# Patient Record
Sex: Male | Born: 1965 | Race: White | Hispanic: No | Marital: Married | Smoking: Never smoker
Health system: Southern US, Community
[De-identification: ages and names within clinical notes are randomized; demographics above are authoritative.]

## PROBLEM LIST (undated history)

## (undated) DIAGNOSIS — IMO0002 Reserved for concepts with insufficient information to code with codable children: Secondary | ICD-10-CM

## (undated) HISTORY — PX: WISDOM TOOTH EXTRACTION: SHX21

## (undated) HISTORY — PX: COLONOSCOPY: SHX174

---

## 2015-08-05 ENCOUNTER — Ambulatory Visit
Admission: EM | Admit: 2015-08-05 | Discharge: 2015-08-05 | Disposition: A | Discharge status: Home / Self Care | Payer: BC Managed Care – PPO | Attending: Family Medicine | Admitting: Family Medicine

## 2015-08-05 DIAGNOSIS — J40 Bronchitis, not specified as acute or chronic: Secondary | ICD-10-CM | POA: Diagnosis not present

## 2015-08-05 HISTORY — DX: Reserved for concepts with insufficient information to code with codable children: IMO0002

## 2015-08-05 MED ORDER — BENZONATATE 100 MG PO CAPS: 200.0000 mg | ORAL_CAPSULE | Freq: Three times a day (TID) | ORAL | Status: DC

## 2015-08-05 MED ORDER — PREDNISONE 20 MG PO TABS: ORAL_TABLET | ORAL | Status: DC

## 2015-08-05 MED ORDER — HYDROCOD POLST-CPM POLST ER 10-8 MG/5ML PO SUER: 5.0000 mL | Freq: Two times a day (BID) | ORAL | Status: DC

## 2015-08-05 NOTE — ED Notes (Signed)
Patient c/o cough and fever which both started 2 weeks ago.  Also, his wife was diagnosed with Bronchitis here in our office yesterday.

## 2015-08-05 NOTE — Discharge Instructions (Signed)
Upper Respiratory Infection, Adult Most upper respiratory infections (URIs) are a viral infection of the air passages leading to the lungs. A URI affects the nose, throat, and upper air passages. The most common type of URI is nasopharyngitis and is typically referred to as "the common cold." URIs run their course and usually go away on their own. Most of the time, a URI does not require medical attention, but sometimes a bacterial infection in the upper airways can follow a viral infection. This is called a secondary infection. Sinus and middle ear infections are common types of secondary upper respiratory infections. Bacterial pneumonia can also complicate a URI. A URI can worsen asthma and chronic obstructive pulmonary disease (COPD). Sometimes, these complications can require emergency medical care and may be life threatening.  CAUSES Almost all URIs are caused by viruses. A virus is a type of germ and can spread from one person to another.  RISKS FACTORS You may be at risk for a URI if:   You smoke.   You have chronic heart or lung disease.  You have a weakened defense (immune) system.   You are very young or very old.   You have nasal allergies or asthma.  You work in crowded or poorly ventilated areas.  You work in health care facilities or schools. SIGNS AND SYMPTOMS  Symptoms typically develop 2-3 days after you come in contact with a cold virus. Most viral URIs last 7-10 days. However, viral URIs from the influenza virus (flu virus) can last 14-18 days and are typically more severe. Symptoms may include:   Runny or stuffy (congested) nose.   Sneezing.   Cough.   Sore throat.   Headache.   Fatigue.   Fever.   Loss of appetite.   Pain in your forehead, behind your eyes, and over your cheekbones (sinus pain).  Muscle aches.  DIAGNOSIS  Your health care provider may diagnose a URI by:  Physical exam.  Tests to check that your symptoms are not due to  another condition such as:  Strep throat.  Sinusitis.  Pneumonia.  Asthma. TREATMENT  A URI goes away on its own with time. It cannot be cured with medicines, but medicines may be prescribed or recommended to relieve symptoms. Medicines may help:  Reduce your fever.  Reduce your cough.  Relieve nasal congestion. HOME CARE INSTRUCTIONS   Take medicines only as directed by your health care provider.   Gargle warm saltwater or take cough drops to comfort your throat as directed by your health care provider.  Use a warm mist humidifier or inhale steam from a shower to increase air moisture. This may make it easier to breathe.  Drink enough fluid to keep your urine clear or pale yellow.   Eat soups and other clear broths and maintain good nutrition.   Rest as needed.   Return to work when your temperature has returned to normal or as your health care provider advises. You may need to stay home longer to avoid infecting others. You can also use a face mask and careful hand washing to prevent spread of the virus.  Increase the usage of your inhaler if you have asthma.   Do not use any tobacco products, including cigarettes, chewing tobacco, or electronic cigarettes. If you need help quitting, ask your health care provider. PREVENTION  The best way to protect yourself from getting a cold is to practice good hygiene.   Avoid oral or hand contact with people with cold   symptoms.   Wash your hands often if contact occurs.  There is no clear evidence that vitamin C, vitamin E, echinacea, or exercise reduces the chance of developing a cold. However, it is always recommended to get plenty of rest, exercise, and practice good nutrition.  SEEK MEDICAL CARE IF:   You are getting worse rather than better.   Your symptoms are not controlled by medicine.   You have chills.  You have worsening shortness of breath.  You have brown or red mucus.  You have yellow or brown nasal  discharge.  You have pain in your face, especially when you bend forward.  You have a fever.  You have swollen neck glands.  You have pain while swallowing.  You have white areas in the back of your throat. SEEK IMMEDIATE MEDICAL CARE IF:   You have severe or persistent:  Headache.  Ear pain.  Sinus pain.  Chest pain.  You have chronic lung disease and any of the following:  Wheezing.  Prolonged cough.  Coughing up blood.  A change in your usual mucus.  You have a stiff neck.  You have changes in your:  Vision.  Hearing.  Thinking.  Mood. MAKE SURE YOU:   Understand these instructions.  Will watch your condition.  Will get help right away if you are not doing well or get worse.   This information is not intended to replace advice given to you by your health care provider. Make sure you discuss any questions you have with your health care provider.   Document Released: 10/18/2000 Document Revised: 09/08/2014 Document Reviewed: 07/30/2013 Elsevier Interactive Patient Education 2016 [employer]  

## 2015-08-05 NOTE — ED Provider Notes (Signed)
CSN: [id number]     Arrival date & time 08/05/15  0804 History   First MD Initiated Contact with Patient 08/05/15 [phone]     No chief complaint on file.  (Consider location/radiation/quality/duration/timing/severity/associated sxs/prior Treatment) HPI   49 year old male who presents with a two-week history of a cough and fever. He states that the cough with his persistent and is not allowing him to sleep. He states that 2 weeks ago started as a simple cold with stuffiness of his nose and a sore throat. That seemed to clear up within the cough began.. Is nonproductive. He is afebrile today. His wife was diagnosed with bronchitis yesterday. He states that her cough is painful  over her chest but his does not bother him  Past Medical History  Diagnosis Date  . Squamous cell carcinoma ([place])    History reviewed. No pertinent past surgical history. History reviewed. No pertinent family history. Social History  Substance Use Topics  . Smoking status: Never Smoker   . Smokeless tobacco: Current User    Types: Chew  . Alcohol Use: No    Review of Systems  Constitutional: Positive for fever, chills, activity change and fatigue.  HENT: Positive for congestion.   Respiratory: Positive for cough. Negative for shortness of breath, wheezing and stridor.   All other systems reviewed and are negative.   Allergies  Review of patient's allergies indicates no known allergies.  Home Medications   Prior to Admission medications   Medication Sig Start Date End Date Taking? Authorizing Provider  benzonatate (TESSALON) 100 MG capsule Take 2 capsules (200 mg total) by mouth every 8 (eight) hours. 08/05/15   Lorin Picket, PA-C  chlorpheniramine-HYDROcodone (TUSSIONEX PENNKINETIC ER) 10-8 MG/5ML SUER Take 5 mLs by mouth 2 (two) times daily. 08/05/15   Lorin Picket, PA-C  predniSONE (DELTASONE) 20 MG tablet Take 2 tablets (40 mg) daily by mouth 08/05/15   Lorin Picket, PA-C   Meds Ordered and  Administered this Visit  Medications - No data to display  BP 122/77 mmHg  Pulse 66  Temp(Src) 97.8 F (36.6 C) (Oral)  Resp 16  Ht 5\' 8"  (1.727 m)  Wt 205 lb (92.987 kg)  BMI 31.18 kg/m2  SpO2 99% No data found.   Physical Exam  Constitutional: He is oriented to person, place, and time. He appears well-developed and well-nourished.  HENT:  Head: Normocephalic and atraumatic.  Right Ear: External ear normal.  Left Ear: External ear normal.  Nose: Nose normal.  Mouth/Throat: Oropharynx is clear and moist. No oropharyngeal exudate.  Eyes: Conjunctivae are normal. Pupils are equal, round, and reactive to light.  Neck: Normal range of motion. Neck supple.  Pulmonary/Chest: Effort normal and breath sounds normal. No respiratory distress. He has no wheezes. He has no rales.  Musculoskeletal: Normal range of motion. He exhibits no edema or tenderness.  Lymphadenopathy:    He has no cervical adenopathy.  Neurological: He is alert and oriented to person, place, and time.  Skin: Skin is warm and dry.  Psychiatric: He has a normal mood and affect. His behavior is normal. Judgment and thought content normal.  Nursing note and vitals reviewed.   ED Course  Procedures (including critical care time)  Labs Review Labs Reviewed - No data to display  Imaging Review No results found.   Visual Acuity Review  Right Eye Distance:   Left Eye Distance:   Bilateral Distance:    Right Eye Near:   Left Eye Near:  Bilateral Near:         MDM   1. Bronchitis    There are no discharge medications for this patient. Plan: 1. Test/x-ray results and diagnosis reviewed with patient 2. rx as per orders; risks, benefits, potential side effects reviewed with patient 3. Recommend supportive treatment with Fluids and rest. Because of his constant coughing we will try a short course of prednisone. I did warn him that it may take 2-3 months for the cough to subside totally. I also told him  this is most likely a viral illness and does not require antibacterial at this time. If he continues to have problems he should follow-up with his primary care physician 4. F/u prn if symptoms worsen or don't improve     Lorin Picket, PA-C 08/05/15 [id number]

## 2017-10-20 ENCOUNTER — Ambulatory Visit
Admission: EM | Admit: 2017-10-20 | Discharge: 2017-10-20 | Disposition: A | Discharge status: Home / Self Care | Payer: BC Managed Care – PPO | Attending: Family Medicine | Admitting: Family Medicine

## 2017-10-20 DIAGNOSIS — J209 Acute bronchitis, unspecified: Secondary | ICD-10-CM

## 2017-10-20 MED ORDER — PREDNISONE 50 MG PO TABS: ORAL_TABLET | ORAL | 0 refills | Status: DC

## 2017-10-20 MED ORDER — DOXYCYCLINE HYCLATE 100 MG PO CAPS: 100.0000 mg | ORAL_CAPSULE | Freq: Two times a day (BID) | ORAL | 0 refills | Status: DC

## 2017-10-20 MED ORDER — HYDROCOD POLST-CPM POLST ER 10-8 MG/5ML PO SUER: 5.0000 mL | Freq: Every evening | ORAL | 0 refills | Status: DC | PRN

## 2017-10-20 MED ORDER — BENZONATATE 100 MG PO CAPS: 100.0000 mg | ORAL_CAPSULE | Freq: Three times a day (TID) | ORAL | 0 refills | Status: DC | PRN

## 2017-10-20 NOTE — Discharge Instructions (Signed)
Prednisone as prescribed.  Tessalon during the day. Tussionex at night.  Antibiotic if you fail to improve or worsen.  Take care  Dr. Lacinda Axon

## 2017-10-20 NOTE — ED Triage Notes (Signed)
As per patient has cold onset 5 days OTC taken not improving, HA, little fluid in ears.

## 2017-10-20 NOTE — ED Provider Notes (Signed)
[facility]    CSN: [id number] Arrival date & time: 10/20/17  0954  History   Chief Complaint Chief Complaint  Patient presents with  . Cough   HPI  52 year old male presents with respiratory symptoms.  Patient reports that he has been sick since Tuesday.  He reports runny nose, congestion, and cough.  Cough is his predominant concern as it is more severe.  Cough is nonproductive.  Worse at night.  He has taken Mucinex and Tessalon without relief.  His cough is interfering with sleep.  No reports of fever or shortness of breath.  No other associated symptoms.  No other complaints at this time.  Social History Social History   Tobacco Use  . Smoking status: Never Smoker  . Smokeless tobacco: Current User    Types: Chew  Substance Use Topics  . Alcohol use: No  . Drug use: Not on file   Allergies   Patient has no known allergies.   Review of Systems Review of Systems  Constitutional: Negative.   HENT: Positive for congestion and rhinorrhea.   Respiratory: Positive for cough. Negative for shortness of breath.    Physical Exam Triage Vital Signs ED Triage Vitals  Enc Vitals Group     BP 10/20/17 1009 117/76     Pulse Rate 10/20/17 1009 67     Resp 10/20/17 1009 16     Temp 10/20/17 1009 98.2 F (36.8 C)     Temp Source 10/20/17 1009 Oral     SpO2 10/20/17 1009 96 %     Weight 10/20/17 1007 208 lb (94.3 kg)     Height 10/20/17 1007 5\' 9"  (1.753 m)     Head Circumference --      Peak Flow --      Pain Score 10/20/17 1024 0     Pain Loc --      Pain Edu? --      Excl. in [place]? --    Updated Vital Signs BP 117/76 (BP Location: Left Arm)   Pulse 67   Temp 98.2 F (36.8 C) (Oral)   Resp 16   Ht 5\' 9"  (1.753 m)   Wt 208 lb (94.3 kg)   SpO2 96%   BMI 30.72 kg/m     Physical Exam  Constitutional: He is oriented to person, place, and time. He appears well-developed. No distress.  HENT:  Head: Normocephalic and atraumatic.  Nose: Nose normal.    Mouth/Throat: Oropharynx is clear and moist.  Cardiovascular: Normal rate and regular rhythm.  Pulmonary/Chest: Effort normal.  Coarse breath sounds.  Neurological: He is alert and oriented to person, place, and time.  Psychiatric: He has a normal mood and affect. His behavior is normal.  Nursing note and vitals reviewed.  UC Treatments / Results  Labs (all labs ordered are listed, but only abnormal results are displayed) Labs Reviewed - No data to display  EKG None  Radiology No results found.  Procedures Procedures (including critical care time)  Medications Ordered in UC Medications - No data to display  Initial Impression / Assessment and Plan / UC Course  I have reviewed the triage vital signs and the nursing notes.  Pertinent labs & imaging results that were available during my care of the patient were reviewed by me and considered in my medical decision making (see chart for details).    52 year old male presents with acute bronchitis.  Treating with prednisone, Tessalon Per[place], Tussionex.  Doxycycline to be filled if he  fails to improve or worsens (wait and see antibiotic).  Final Clinical Impressions(s) / UC Diagnoses   Final diagnoses:  Acute bronchitis, unspecified organism     Discharge Instructions     Prednisone as prescribed.  Tessalon during the day. Tussionex at night.  Antibiotic if you fail to improve or worsen.  Take care  Dr. Lacinda Axon    ED Prescriptions    Medication Sig Dispense Auth. Provider   predniSONE (DELTASONE) 50 MG tablet 1 tablet daily x 5 days. 5 tablet Geneen Dieter G, DO   benzonatate (TESSALON) 100 MG capsule Take 1 capsule (100 mg total) by mouth 3 (three) times daily as needed. 30 capsule [place], [place] G, DO   chlorpheniramine-HYDROcodone (TUSSIONEX PENNKINETIC ER) 10-8 MG/5ML SUER Take 5 mLs by mouth at bedtime as needed. 60 mL Larine Fielding G, DO   doxycycline (VIBRAMYCIN) 100 MG capsule Take 1 capsule (100 mg total) by mouth  2 (two) times daily. 14 capsule Coral Spikes, DO     Controlled Substance Prescriptions [place] Controlled Substance Registry consulted? No; Not needed as Rx is cough suppressant.    Coral Spikes, DO 10/20/17 1030

## 2021-03-01 ENCOUNTER — Other Ambulatory Visit: Payer: Self-pay | Admitting: Nurse Practitioner

## 2021-03-01 DIAGNOSIS — R1033 Periumbilical pain: Secondary | ICD-10-CM

## 2021-03-01 DIAGNOSIS — K429 Umbilical hernia without obstruction or gangrene: Secondary | ICD-10-CM

## 2021-03-01 DIAGNOSIS — R1900 Intra-abdominal and pelvic swelling, mass and lump, unspecified site: Secondary | ICD-10-CM

## 2021-03-10 ENCOUNTER — Ambulatory Visit
Admission: RE | Admit: 2021-03-10 | Discharge: 2021-03-10 | Disposition: A | Discharge status: Home / Self Care | Payer: No Typology Code available for payment source | Source: Ambulatory Visit | Attending: Nurse Practitioner | Admitting: Nurse Practitioner

## 2021-03-10 ENCOUNTER — Other Ambulatory Visit: Payer: Self-pay

## 2021-03-10 DIAGNOSIS — K429 Umbilical hernia without obstruction or gangrene: Secondary | ICD-10-CM | POA: Diagnosis present

## 2021-03-10 DIAGNOSIS — R1033 Periumbilical pain: Secondary | ICD-10-CM | POA: Diagnosis present

## 2021-03-10 DIAGNOSIS — R1900 Intra-abdominal and pelvic swelling, mass and lump, unspecified site: Secondary | ICD-10-CM | POA: Insufficient documentation

## 2021-05-17 ENCOUNTER — Ambulatory Visit: Payer: Self-pay | Admitting: General Surgery

## 2021-05-17 NOTE — H&P (Signed)
PATIENT PROFILE: Markell Sciascia is a 56 y.o. male who presents to the Clinic for consultation at the request of Dr. Dayton Martes for evaluation of umbilical hernia.  PCP: Sallee Lange, NP  HISTORY OF PRESENT ILLNESS: [name] reports he has had a ventral hernia since many years ago. He endorses that recently he had an episode that the hernia protrudes and it was very painful. Initially it was difficult to reduce the hernia but he finally was able to reduce it completely. He was seen by PCP, CT scan of the abdomen pelvis was done showing fat-containing ventral hernia with mild stranding concerning of chronic incarceration. Patient endorses that he has intermittent midline anterior abdominal pain. Pain aggravated by talking loud. No specific alleviating factors identified. Patient denies any episode of abdominal distention nausea or vomiting or ischemic changes around the hernia. I personally read the images. There is also a small umbilical hernia.  PROBLEM LIST: Problem List Date Reviewed: 02/25/2021  Noted  Aortic atherosclerosis (CMS-HCC) 03/2021  Overview  Incidentally noted on CT of abdomen.   Obesity (BMI 30-39.9), unspecified Unknown  Umbilical hernia Unknown  IGT (impaired glucose tolerance) 05/08/2017  Overview  A1c 5.7%   Hyperlipidemia 05/08/2014  Overview  Mild    GENERAL REVIEW OF SYSTEMS:   General ROS: negative for - chills, fatigue, fever, weight gain or weight loss Allergy and Immunology ROS: negative for - hives  Hematological and Lymphatic ROS: negative for - bleeding problems or bruising, negative for palpable nodes Endocrine ROS: negative for - heat or cold intolerance, hair changes Respiratory ROS: negative for - cough, shortness of breath or wheezing Cardiovascular ROS: no chest pain or palpitations GI ROS: negative for nausea, vomiting, diarrhea, constipation. Positive for abdominal pain Musculoskeletal ROS: negative for - joint swelling or  muscle pain Neurological ROS: negative for - confusion, syncope Dermatological ROS: negative for pruritus and rash Psychiatric: negative for anxiety, depression, difficulty sleeping and memory loss  MEDICATIONS: Current Outpatient Medications  Medication Sig Dispense Refill   Herbal Supplement Herbal Name: Essential oils sometimes   No current facility-administered medications for this visit.   ALLERGIES: Patient has no known allergies.  PAST MEDICAL HISTORY: Past Medical History:  Diagnosis Date   Aortic atherosclerosis (CMS-HCC) 03/2021  Incidentally noted on CT of abdomen.   Arthritis  Rt knee   History of chicken pox   Hyperlipidemia 2016  Mild   IGT (impaired glucose tolerance) 05/2017  A1c 5.7%   Obesity (BMI 30-39.9), unspecified   Umbilical hernia   PAST SURGICAL HISTORY: Past Surgical History:  Procedure Laterality Date   COLONOSCOPY 12/22/2020  Tubular adenoma/Hyperplastic polyp/Repeat 87yr/SMR   PHOTOREFRACTIVE KERATOTOMY/LASIK   VASECTOMY    FAMILY HISTORY: Family History  Problem Relation Age of Onset   Diabetes type II Father   High blood pressure (Hypertension) Father   No Known Problems Sister   No Known Problems Brother   No Known Problems Daughter   No Known Problems Son   Bone cancer Paternal Grandmother   Stroke Paternal Grandfather  Smoker & binge drinker   No Known Problems Brother    SOCIAL HISTORY: Social History   Socioeconomic History   Marital status: Married  Spouse name: JSharee Pimple  Number of children: 2  Occupational History   Occupation: PEnvironmental education officer Tobacco Use   Smoking status: Never   Smokeless tobacco: Current  Types: Snuff   Tobacco comments:  1 can smokeless tobacco/day  Vaping Use   Vaping Use: Never used  Substance  and Sexual Activity   Alcohol use: No   Drug use: No   Sexual activity: Yes  Partners: Female  Birth control/protection: None, Surgical   PHYSICAL EXAM: Vitals:  04/07/21 1346  BP: (!) 148/85   Pulse: 71   Body mass index is 30.42 kg/m. Weight: 93.4 kg (206 lb)   GENERAL: Alert, active, oriented x3  HEENT: Pupils equal reactive to light. Extraocular movements are intact. Sclera clear. Palpebral conjunctiva normal red color.Pharynx clear.  NECK: Supple with no palpable mass and no adenopathy.  LUNGS: Sound clear with no rales rhonchi or wheezes.  HEART: Regular rhythm S1 and S2 without murmur.  ABDOMEN: Soft and depressible, nontender with no palpable mass, no hepatomegaly. Soft partially incarcerated ventral hernia, no skin changes minimal tender to palpation at this moment.  EXTREMITIES: Well-developed well-nourished symmetrical with no dependent edema.  NEUROLOGICAL: Awake alert oriented, facial expression symmetrical, moving all extremities.  REVIEW OF DATA: I have reviewed the following data today: No visits with results within 3 Month(s) from this visit.  Latest known visit with results is:  Office Visit on 08/11/2020  Component Date Value   WBC (White Blood Cell Co* 08/11/2020 4.2   RBC (Red Blood Cell Coun* 08/11/2020 4.99   Hemoglobin 08/11/2020 15.2   Hematocrit 08/11/2020 43.7   MCV (Mean Corpuscular Vo* 08/11/2020 87.6   MCH (Mean Corpuscular He* 08/11/2020 30.5   MCHC (Mean Corpuscular H* 08/11/2020 34.8   Platelet Count 08/11/2020 157   RDW-CV (Red Cell Distrib* 08/11/2020 12.8   MPV (Mean Platelet Volum* 08/11/2020 10.2   Neutrophils 08/11/2020 2.30   Lymphocytes 08/11/2020 1.40   Mixed Count 08/11/2020 0.50   Neutrophil % 08/11/2020 55.1   Lymphocyte % 08/11/2020 32.8   Mixed % 08/11/2020 12.1   Glucose 08/11/2020 100   Sodium 08/11/2020 140   Potassium 08/11/2020 4.4   Chloride 08/11/2020 105   Carbon Dioxide (CO2) 08/11/2020 29.0   Urea Nitrogen (BUN) 08/11/2020 14   Creatinine 08/11/2020 1.1   Glomerular Filtration Ra* 08/11/2020 69   Calcium 08/11/2020 10.1   AST 08/11/2020 41 (H)   ALT 08/11/2020 77 (H)   Alk Phos (alkaline Phosp*  08/11/2020 76   Albumin 08/11/2020 4.5   Bilirubin, Total 08/11/2020 0.7   Protein, Total 08/11/2020 7.2   A/G Ratio 08/11/2020 1.7   Hep C Virus Ab - [facility] 08/11/2020 <0.1   Hemoglobin A1C 08/11/2020 5.7 (H)   Average Blood Glucose (C* 08/11/2020 117   Cholesterol, Total 08/11/2020 171   Triglyceride 08/11/2020 175   HDL (High Density Lipopr* 08/11/2020 31.5   LDL Calculated 08/11/2020 105   VLDL Cholesterol 08/11/2020 35   Cholesterol/HDL Ratio 08/11/2020 5.4   PSA (Prostate Specific A* 08/11/2020 0.24   Thyroid Stimulating Horm* 08/11/2020 2.708    ASSESSMENT: Mr. Colt is a 56 y.o. male presenting for consultation for ventral hernia.   The patient presents with a symptomatic ventral hernia. Patient was oriented about the diagnosis of ventral hernia and its implication. The patient was oriented about the treatment alternatives (observation vs surgical repair). Due to patient symptoms, repair is recommended. Patient oriented about the surgical procedure, the use of mesh and its risk of complications such as: infection, bleeding, injury to vasculature, injury to bowel or bladder, and chronic pain, intestinal obstruction, among others.   Ventral hernia without obstruction or gangrene [K43.9]  PLAN: 1. Robotic assisted laparoscopic ventral hernia repair with mesh ([id number], [id number]) 2. CBC, CMP 3. Avoid taking aspirin 5 days before procedure 4.  Contact us if has any question or concern.   Patient verbalized understanding, all questions were answered, and were agreeable with the plan outlined above.   Herbert Pun, MD

## 2021-05-17 NOTE — H&P (View-Only) (Signed)
PATIENT PROFILE: Andres Underwood is a 56 y.o. male who presents to the Clinic for consultation at the request of Dr. Dayton Martes for evaluation of umbilical hernia.  PCP: Sallee Lange, NP  HISTORY OF PRESENT ILLNESS: [name] reports he has had a ventral hernia since many years ago. He endorses that recently he had an episode that the hernia protrudes and it was very painful. Initially it was difficult to reduce the hernia but he finally was able to reduce it completely. He was seen by PCP, CT scan of the abdomen pelvis was done showing fat-containing ventral hernia with mild stranding concerning of chronic incarceration. Patient endorses that he has intermittent midline anterior abdominal pain. Pain aggravated by talking loud. No specific alleviating factors identified. Patient denies any episode of abdominal distention nausea or vomiting or ischemic changes around the hernia. I personally read the images. There is also a small umbilical hernia.  PROBLEM LIST: Problem List Date Reviewed: 02/25/2021  Noted  Aortic atherosclerosis (CMS-HCC) 03/2021  Overview  Incidentally noted on CT of abdomen.   Obesity (BMI 30-39.9), unspecified Unknown  Umbilical hernia Unknown  IGT (impaired glucose tolerance) 05/08/2017  Overview  A1c 5.7%   Hyperlipidemia 05/08/2014  Overview  Mild    GENERAL REVIEW OF SYSTEMS:   General ROS: negative for - chills, fatigue, fever, weight gain or weight loss Allergy and Immunology ROS: negative for - hives  Hematological and Lymphatic ROS: negative for - bleeding problems or bruising, negative for palpable nodes Endocrine ROS: negative for - heat or cold intolerance, hair changes Respiratory ROS: negative for - cough, shortness of breath or wheezing Cardiovascular ROS: no chest pain or palpitations GI ROS: negative for nausea, vomiting, diarrhea, constipation. Positive for abdominal pain Musculoskeletal ROS: negative for - joint swelling or  muscle pain Neurological ROS: negative for - confusion, syncope Dermatological ROS: negative for pruritus and rash Psychiatric: negative for anxiety, depression, difficulty sleeping and memory loss  MEDICATIONS: Current Outpatient Medications  Medication Sig Dispense Refill   Herbal Supplement Herbal Name: Essential oils sometimes   No current facility-administered medications for this visit.   ALLERGIES: Patient has no known allergies.  PAST MEDICAL HISTORY: Past Medical History:  Diagnosis Date   Aortic atherosclerosis (CMS-HCC) 03/2021  Incidentally noted on CT of abdomen.   Arthritis  Rt knee   History of chicken pox   Hyperlipidemia 2016  Mild   IGT (impaired glucose tolerance) 05/2017  A1c 5.7%   Obesity (BMI 30-39.9), unspecified   Umbilical hernia   PAST SURGICAL HISTORY: Past Surgical History:  Procedure Laterality Date   COLONOSCOPY 12/22/2020  Tubular adenoma/Hyperplastic polyp/Repeat 87yr/SMR   PHOTOREFRACTIVE KERATOTOMY/LASIK   VASECTOMY    FAMILY HISTORY: Family History  Problem Relation Age of Onset   Diabetes type II Father   High blood pressure (Hypertension) Father   No Known Problems Sister   No Known Problems Brother   No Known Problems Daughter   No Known Problems Son   Bone cancer Paternal Grandmother   Stroke Paternal Grandfather  Smoker & binge drinker   No Known Problems Brother    SOCIAL HISTORY: Social History   Socioeconomic History   Marital status: Married  Spouse name: JSharee Pimple  Number of children: 2  Occupational History   Occupation: PEnvironmental education officer Tobacco Use   Smoking status: Never   Smokeless tobacco: Current  Types: Snuff   Tobacco comments:  1 can smokeless tobacco/day  Vaping Use   Vaping Use: Never used  Substance  and Sexual Activity   Alcohol use: No   Drug use: No   Sexual activity: Yes  Partners: Female  Birth control/protection: None, Surgical   PHYSICAL EXAM: Vitals:  04/07/21 1346  BP: (!) 148/85   Pulse: 71   Body mass index is 30.42 kg/m. Weight: 93.4 kg (206 lb)   GENERAL: Alert, active, oriented x3  HEENT: Pupils equal reactive to light. Extraocular movements are intact. Sclera clear. Palpebral conjunctiva normal red color.Pharynx clear.  NECK: Supple with no palpable mass and no adenopathy.  LUNGS: Sound clear with no rales rhonchi or wheezes.  HEART: Regular rhythm S1 and S2 without murmur.  ABDOMEN: Soft and depressible, nontender with no palpable mass, no hepatomegaly. Soft partially incarcerated ventral hernia, no skin changes minimal tender to palpation at this moment.  EXTREMITIES: Well-developed well-nourished symmetrical with no dependent edema.  NEUROLOGICAL: Awake alert oriented, facial expression symmetrical, moving all extremities.  REVIEW OF DATA: I have reviewed the following data today: No visits with results within 3 Month(s) from this visit.  Latest known visit with results is:  Office Visit on 08/11/2020  Component Date Value   WBC (White Blood Cell Co* 08/11/2020 4.2   RBC (Red Blood Cell Coun* 08/11/2020 4.99   Hemoglobin 08/11/2020 15.2   Hematocrit 08/11/2020 43.7   MCV (Mean Corpuscular Vo* 08/11/2020 87.6   MCH (Mean Corpuscular He* 08/11/2020 30.5   MCHC (Mean Corpuscular H* 08/11/2020 34.8   Platelet Count 08/11/2020 157   RDW-CV (Red Cell Distrib* 08/11/2020 12.8   MPV (Mean Platelet Volum* 08/11/2020 10.2   Neutrophils 08/11/2020 2.30   Lymphocytes 08/11/2020 1.40   Mixed Count 08/11/2020 0.50   Neutrophil % 08/11/2020 55.1   Lymphocyte % 08/11/2020 32.8   Mixed % 08/11/2020 12.1   Glucose 08/11/2020 100   Sodium 08/11/2020 140   Potassium 08/11/2020 4.4   Chloride 08/11/2020 105   Carbon Dioxide (CO2) 08/11/2020 29.0   Urea Nitrogen (BUN) 08/11/2020 14   Creatinine 08/11/2020 1.1   Glomerular Filtration Ra* 08/11/2020 69   Calcium 08/11/2020 10.1   AST 08/11/2020 41 (H)   ALT 08/11/2020 77 (H)   Alk Phos (alkaline Phosp*  08/11/2020 76   Albumin 08/11/2020 4.5   Bilirubin, Total 08/11/2020 0.7   Protein, Total 08/11/2020 7.2   A/G Ratio 08/11/2020 1.7   Hep C Virus Ab - [facility] 08/11/2020 <0.1   Hemoglobin A1C 08/11/2020 5.7 (H)   Average Blood Glucose (C* 08/11/2020 117   Cholesterol, Total 08/11/2020 171   Triglyceride 08/11/2020 175   HDL (High Density Lipopr* 08/11/2020 31.5   LDL Calculated 08/11/2020 105   VLDL Cholesterol 08/11/2020 35   Cholesterol/HDL Ratio 08/11/2020 5.4   PSA (Prostate Specific A* 08/11/2020 0.24   Thyroid Stimulating Horm* 08/11/2020 2.708    ASSESSMENT: Mr. Colt is a 56 y.o. male presenting for consultation for ventral hernia.   The patient presents with a symptomatic ventral hernia. Patient was oriented about the diagnosis of ventral hernia and its implication. The patient was oriented about the treatment alternatives (observation vs surgical repair). Due to patient symptoms, repair is recommended. Patient oriented about the surgical procedure, the use of mesh and its risk of complications such as: infection, bleeding, injury to vasculature, injury to bowel or bladder, and chronic pain, intestinal obstruction, among others.   Ventral hernia without obstruction or gangrene [K43.9]  PLAN: 1. Robotic assisted laparoscopic ventral hernia repair with mesh ([id number], [id number]) 2. CBC, CMP 3. Avoid taking aspirin 5 days before procedure 4.  Contact us if has any question or concern.   Patient verbalized understanding, all questions were answered, and were agreeable with the plan outlined above.   Herbert Pun, MD

## 2021-05-19 ENCOUNTER — Encounter
Admission: RE | Admit: 2021-05-19 | Discharge: 2021-05-19 | Disposition: A | Discharge status: Home / Self Care | Payer: BC Managed Care – PPO | Source: Ambulatory Visit | Attending: General Surgery | Admitting: General Surgery

## 2021-05-19 ENCOUNTER — Other Ambulatory Visit: Payer: Self-pay

## 2021-05-19 NOTE — Patient Instructions (Signed)
Your procedure is scheduled on: 05/23/21 Report to [facility]. To find out your arrival time please call [phone] between 1PM - 3PM on 05/20/21.  Remember: Instructions that are not followed completely may result in serious medical risk, up to and including death, or upon the discretion of your surgeon and anesthesiologist your surgery may need to be rescheduled.     _X__ 1. Do not eat food or ddrink any liquids after midnight the night before your procedure.                 No gum chewing or hard candies.   __X__2.  On the morning of surgery brush your teeth with toothpaste and water, you                 may rinse your mouth with mouthwash if you wish.  Do not swallow any              toothpaste of mouthwash.     _X__ 3.  No Alcohol for 24 hours before or after surgery.   _X__ 4.  Do Not Smoke or use e-cigarettes For 24 Hours Prior to Your Surgery.                 Do not use any chewable tobacco products for at least 6 hours prior to                 surgery.  ____  5.  Bring all medications with you on the day of surgery if instructed.   __X__  6.  Notify your doctor if there is any change in your medical condition      (cold, fever, infections).     Do not wear jewelry, make-up, hairpins, clips or nail polish. Do not wear lotions, powders, or perfumes.  Do not shave body hair 48 hours prior to surgery. Men may shave face and neck. Do not bring valuables to the hospital.    [facility] is not responsible for any belongings or valuables.  Contacts, dentures/partials or body piercings may not be worn into surgery. Bring a case for your contacts, glasses or hearing aids, a denture cup will be supplied. Leave your suitcase in the car. After surgery it may be brought to your room. For patients admitted to the hospital, discharge time is determined by your treatment team.   Patients discharged the day of surgery will not be  allowed to drive home.   Please read over the following fact sheets that you were given:     __X__ Take these medicines the morning of surgery with A SIP OF WATER:    1. none  2.   3.   4.  5.  6.  ____ Fleet Enema (as directed)   ____ Use CHG Soap/SAGE wipes as directed  ____ Use inhalers on the day of surgery  ____ Stop metformin/Janumet/Farxiga 2 days prior to surgery    ____ Take 1/2 of usual insulin dose the night before surgery. No insulin the morning          of surgery.   ____ Stop Blood Thinners Coumadin/Plavix/Xarelto/Pleta/Pradaxa/Eliquis/Effient/Aspirin  on   Or contact your Surgeon, Cardiologist or Medical Doctor regarding  ability to stop your blood thinners  __X__ Stop Anti-inflammatories 7 days before surgery such as Advil, Ibuprofen, Motrin,  BC or Goodies Powder, Naprosyn, Naproxen, Aleve, Aspirin    __X__ Stop all herbals and supplements, fish oil or  vitamins  until after surgery.    ____ Bring C-Pap to the hospital.

## 2021-05-23 ENCOUNTER — Ambulatory Visit: Payer: BC Managed Care – PPO | Admitting: Anesthesiology

## 2021-05-23 ENCOUNTER — Encounter: Payer: Self-pay | Admitting: General Surgery

## 2021-05-23 ENCOUNTER — Other Ambulatory Visit: Payer: Self-pay

## 2021-05-23 ENCOUNTER — Ambulatory Visit
Admission: RE | Admit: 2021-05-23 | Discharge: 2021-05-23 | Disposition: A | Discharge status: Home / Self Care | Payer: BC Managed Care – PPO | Attending: General Surgery | Admitting: General Surgery

## 2021-05-23 ENCOUNTER — Encounter
Admission: RE | Disposition: A | Discharge status: Home / Self Care | Payer: Self-pay | Source: Home / Self Care | Attending: General Surgery

## 2021-05-23 DIAGNOSIS — K219 Gastro-esophageal reflux disease without esophagitis: Secondary | ICD-10-CM | POA: Diagnosis not present

## 2021-05-23 DIAGNOSIS — C4492 Squamous cell carcinoma of skin, unspecified: Secondary | ICD-10-CM | POA: Insufficient documentation

## 2021-05-23 DIAGNOSIS — E669 Obesity, unspecified: Secondary | ICD-10-CM | POA: Insufficient documentation

## 2021-05-23 DIAGNOSIS — K439 Ventral hernia without obstruction or gangrene: Secondary | ICD-10-CM | POA: Diagnosis not present

## 2021-05-23 DIAGNOSIS — K429 Umbilical hernia without obstruction or gangrene: Secondary | ICD-10-CM | POA: Insufficient documentation

## 2021-05-23 DIAGNOSIS — M1990 Unspecified osteoarthritis, unspecified site: Secondary | ICD-10-CM | POA: Insufficient documentation

## 2021-05-23 DIAGNOSIS — Z6830 Body mass index (BMI) 30.0-30.9, adult: Secondary | ICD-10-CM | POA: Insufficient documentation

## 2021-05-23 HISTORY — PX: XI ROBOTIC ASSISTED VENTRAL HERNIA: SHX6789

## 2021-05-23 HISTORY — PX: INSERTION OF MESH: SHX5868

## 2021-05-23 SURGERY — REPAIR, HERNIA, VENTRAL, ROBOT-ASSISTED
Anesthesia: General | Site: Abdomen

## 2021-05-23 MED ORDER — LACTATED RINGERS IV SOLN: INTRAVENOUS | Status: DC

## 2021-05-23 MED ORDER — LIDOCAINE HCL (CARDIAC) PF 100 MG/5ML IV SOSY
PREFILLED_SYRINGE | INTRAVENOUS | Status: DC | PRN
  Administered 2021-05-23: 100 mg via INTRAVENOUS

## 2021-05-23 MED ORDER — ACETAMINOPHEN 10 MG/ML IV SOLN: 1000.0000 mg | Freq: Once | INTRAVENOUS | Status: DC | PRN

## 2021-05-23 MED ORDER — SUGAMMADEX SODIUM 200 MG/2ML IV SOLN
INTRAVENOUS | Status: DC | PRN
  Administered 2021-05-23: 200 mg via INTRAVENOUS

## 2021-05-23 MED ORDER — KETAMINE HCL 50 MG/5ML IJ SOSY
PREFILLED_SYRINGE | INTRAMUSCULAR | Status: AC
  Filled 2021-05-23: qty 5

## 2021-05-23 MED ORDER — CEFAZOLIN SODIUM-DEXTROSE 2-4 GM/100ML-% IV SOLN
INTRAVENOUS | Status: AC
  Filled 2021-05-23: qty 100

## 2021-05-23 MED ORDER — KETAMINE HCL 10 MG/ML IJ SOLN
INTRAMUSCULAR | Status: DC | PRN
Start: 2021-05-23 — End: 2021-05-23
  Administered 2021-05-23: 20 mg via INTRAVENOUS
  Administered 2021-05-23: 30 mg via INTRAVENOUS

## 2021-05-23 MED ORDER — ORAL CARE MOUTH RINSE: 15.0000 mL | Freq: Once | OROMUCOSAL | Status: AC

## 2021-05-23 MED ORDER — LIDOCAINE HCL (PF) 2 % IJ SOLN
INTRAMUSCULAR | Status: AC
  Filled 2021-05-23: qty 5

## 2021-05-23 MED ORDER — FENTANYL CITRATE (PF) 100 MCG/2ML IJ SOLN
INTRAMUSCULAR | Status: AC
  Administered 2021-05-23: 25 ug via INTRAVENOUS
  Filled 2021-05-23: qty 2

## 2021-05-23 MED ORDER — OXYCODONE HCL 5 MG/5ML PO SOLN: 5.0000 mg | Freq: Once | ORAL | Status: AC | PRN

## 2021-05-23 MED ORDER — PHENYLEPHRINE HCL-NACL 20-0.9 MG/250ML-% IV SOLN
INTRAVENOUS | Status: AC
  Filled 2021-05-23: qty 250

## 2021-05-23 MED ORDER — HYDROCODONE-ACETAMINOPHEN 5-325 MG PO TABS: 1.0000 | ORAL_TABLET | ORAL | 0 refills | Status: AC | PRN

## 2021-05-23 MED ORDER — ROCURONIUM BROMIDE 10 MG/ML (PF) SYRINGE
PREFILLED_SYRINGE | INTRAVENOUS | Status: AC
  Filled 2021-05-23: qty 10

## 2021-05-23 MED ORDER — PROPOFOL 10 MG/ML IV BOLUS
INTRAVENOUS | Status: AC
  Filled 2021-05-23: qty 20

## 2021-05-23 MED ORDER — DEXAMETHASONE SODIUM PHOSPHATE 10 MG/ML IJ SOLN
INTRAMUSCULAR | Status: DC | PRN
  Administered 2021-05-23: 5 mg via INTRAVENOUS

## 2021-05-23 MED ORDER — ACETAMINOPHEN 10 MG/ML IV SOLN
INTRAVENOUS | Status: AC
  Filled 2021-05-23: qty 100

## 2021-05-23 MED ORDER — CHLORHEXIDINE GLUCONATE 0.12 % MT SOLN: 15.0000 mL | Freq: Once | OROMUCOSAL | Status: AC

## 2021-05-23 MED ORDER — MIDAZOLAM HCL 2 MG/2ML IJ SOLN
INTRAMUSCULAR | Status: DC | PRN
  Administered 2021-05-23: 2 mg via INTRAVENOUS

## 2021-05-23 MED ORDER — BUPIVACAINE-EPINEPHRINE (PF) 0.25% -1:200000 IJ SOLN
INTRAMUSCULAR | Status: AC
  Filled 2021-05-23: qty 30

## 2021-05-23 MED ORDER — FENTANYL CITRATE (PF) 100 MCG/2ML IJ SOLN
25.0000 ug | INTRAMUSCULAR | Status: DC | PRN
  Administered 2021-05-23 (×2): 25 ug via INTRAVENOUS

## 2021-05-23 MED ORDER — ONDANSETRON HCL 4 MG/2ML IJ SOLN
INTRAMUSCULAR | Status: DC | PRN
Start: 2021-05-23 — End: 2021-05-23
  Administered 2021-05-23: 4 mg via INTRAVENOUS

## 2021-05-23 MED ORDER — BUPIVACAINE-EPINEPHRINE (PF) 0.25% -1:200000 IJ SOLN
INTRAMUSCULAR | Status: DC | PRN
  Administered 2021-05-23: 30 mL

## 2021-05-23 MED ORDER — FENTANYL CITRATE (PF) 100 MCG/2ML IJ SOLN
INTRAMUSCULAR | Status: AC
  Filled 2021-05-23: qty 2

## 2021-05-23 MED ORDER — DEXAMETHASONE SODIUM PHOSPHATE 10 MG/ML IJ SOLN
INTRAMUSCULAR | Status: AC
  Filled 2021-05-23: qty 1

## 2021-05-23 MED ORDER — FAMOTIDINE 20 MG PO TABS
ORAL_TABLET | ORAL | Status: AC
  Administered 2021-05-23: 20 mg via ORAL
  Filled 2021-05-23: qty 1

## 2021-05-23 MED ORDER — ACETAMINOPHEN 10 MG/ML IV SOLN
INTRAVENOUS | Status: DC | PRN
  Administered 2021-05-23: 1000 mg via INTRAVENOUS

## 2021-05-23 MED ORDER — SEVOFLURANE IN SOLN
RESPIRATORY_TRACT | Status: AC
  Filled 2021-05-23: qty 250

## 2021-05-23 MED ORDER — ONDANSETRON HCL 4 MG/2ML IJ SOLN
INTRAMUSCULAR | Status: AC
  Filled 2021-05-23: qty 2

## 2021-05-23 MED ORDER — FAMOTIDINE 20 MG PO TABS
20.0000 mg | ORAL_TABLET | Freq: Once | ORAL | Status: AC
Start: 2021-05-23 — End: 2021-05-23

## 2021-05-23 MED ORDER — OXYCODONE HCL 5 MG PO TABS: 5.0000 mg | ORAL_TABLET | Freq: Once | ORAL | Status: AC | PRN

## 2021-05-23 MED ORDER — ONDANSETRON HCL 4 MG/2ML IJ SOLN: 4.0000 mg | Freq: Once | INTRAMUSCULAR | Status: DC | PRN

## 2021-05-23 MED ORDER — EPHEDRINE SULFATE 50 MG/ML IJ SOLN
INTRAMUSCULAR | Status: DC | PRN
  Administered 2021-05-23: 5 mg via INTRAVENOUS
  Administered 2021-05-23: 10 mg via INTRAVENOUS
  Administered 2021-05-23: 5 mg via INTRAVENOUS

## 2021-05-23 MED ORDER — PHENYLEPHRINE 40 MCG/ML (10ML) SYRINGE FOR IV PUSH (FOR BLOOD PRESSURE SUPPORT)
PREFILLED_SYRINGE | INTRAVENOUS | Status: DC | PRN
  Administered 2021-05-23: 160 ug via INTRAVENOUS
  Administered 2021-05-23 (×2): 80 ug via INTRAVENOUS
  Administered 2021-05-23 (×2): 160 ug via INTRAVENOUS
  Administered 2021-05-23: 80 ug via INTRAVENOUS

## 2021-05-23 MED ORDER — OXYCODONE HCL 5 MG PO TABS
ORAL_TABLET | ORAL | Status: AC
  Administered 2021-05-23: 5 mg via ORAL
  Filled 2021-05-23: qty 1

## 2021-05-23 MED ORDER — ROCURONIUM BROMIDE 100 MG/10ML IV SOLN
INTRAVENOUS | Status: DC | PRN
Start: 2021-05-23 — End: 2021-05-23
  Administered 2021-05-23: 50 mg via INTRAVENOUS
  Administered 2021-05-23: 20 mg via INTRAVENOUS

## 2021-05-23 MED ORDER — MIDAZOLAM HCL 2 MG/2ML IJ SOLN
INTRAMUSCULAR | Status: AC
  Filled 2021-05-23: qty 2

## 2021-05-23 MED ORDER — CHLORHEXIDINE GLUCONATE 0.12 % MT SOLN
OROMUCOSAL | Status: AC
  Administered 2021-05-23: 15 mL via OROMUCOSAL
  Filled 2021-05-23: qty 15

## 2021-05-23 MED ORDER — PROPOFOL 10 MG/ML IV BOLUS
INTRAVENOUS | Status: DC | PRN
  Administered 2021-05-23: 180 mg via INTRAVENOUS

## 2021-05-23 MED ORDER — CEFAZOLIN SODIUM-DEXTROSE 2-4 GM/100ML-% IV SOLN
2.0000 g | INTRAVENOUS | Status: AC
  Administered 2021-05-23: 2 g via INTRAVENOUS

## 2021-05-23 MED ORDER — FENTANYL CITRATE (PF) 100 MCG/2ML IJ SOLN
INTRAMUSCULAR | Status: DC | PRN
  Administered 2021-05-23 (×2): 50 ug via INTRAVENOUS

## 2021-05-23 SURGICAL SUPPLY — 45 items
BLADE SURG SZ11 CARB STEEL (BLADE) ×3 IMPLANT
COVER TIP SHEARS 8 DVNC (MISCELLANEOUS) ×2 IMPLANT
COVER TIP SHEARS 8MM DA VINCI (MISCELLANEOUS) ×1
COVER WAND RF STERILE (DRAPES) ×3 IMPLANT
DERMABOND ADVANCED (GAUZE/BANDAGES/DRESSINGS) ×1
DERMABOND ADVANCED .7 DNX12 (GAUZE/BANDAGES/DRESSINGS) ×2 IMPLANT
DRAPE ARM DVNC X/XI (DISPOSABLE) ×6 IMPLANT
DRAPE COLUMN DVNC XI (DISPOSABLE) ×2 IMPLANT
DRAPE DA VINCI XI ARM (DISPOSABLE) ×3
DRAPE DA VINCI XI COLUMN (DISPOSABLE) ×1
ELECT REM PT RETURN 9FT ADLT (ELECTROSURGICAL) ×3
ELECTRODE REM PT RTRN 9FT ADLT (ELECTROSURGICAL) ×2 IMPLANT
GAUZE 4X4 16PLY ~~LOC~~+RFID DBL (SPONGE) ×3 IMPLANT
GLOVE SURG ENC MOIS LTX SZ6.5 (GLOVE) ×7 IMPLANT
GLOVE SURG UNDER POLY LF SZ6.5 (GLOVE) ×7 IMPLANT
GOWN STRL REUS W/ TWL LRG LVL3 (GOWN DISPOSABLE) ×6 IMPLANT
GOWN STRL REUS W/TWL LRG LVL3 (GOWN DISPOSABLE) ×3
IRRIGATOR SUCT 8 DISP DVNC XI (IRRIGATION / IRRIGATOR) IMPLANT
IRRIGATOR SUCTION 8MM XI DISP (IRRIGATION / IRRIGATOR)
IV NS 1000ML (IV SOLUTION)
IV NS 1000ML BAXH (IV SOLUTION) IMPLANT
KIT PINK PAD W/HEAD ARE REST (MISCELLANEOUS) ×3 IMPLANT
KIT PINK PAD W/HEAD ARM REST (MISCELLANEOUS) ×2 IMPLANT
LABEL OR SOLS (LABEL) ×3 IMPLANT
MANIFOLD NEPTUNE II (INSTRUMENTS) ×3 IMPLANT
MESH VENTRALIGHT ST 4X6IN (Mesh General) ×1 IMPLANT
NDL INSUFFLATION 14GA 120MM (NEEDLE) ×2 IMPLANT
NEEDLE HYPO 22GX1.5 SAFETY (NEEDLE) ×3 IMPLANT
NEEDLE INSUFFLATION 14GA 120MM (NEEDLE) ×3 IMPLANT
OBTURATOR OPTICAL STANDARD 8MM (TROCAR) ×1
OBTURATOR OPTICAL STND 8 DVNC (TROCAR) ×2
OBTURATOR OPTICALSTD 8 DVNC (TROCAR) ×2 IMPLANT
PACK LAP CHOLECYSTECTOMY (MISCELLANEOUS) ×3 IMPLANT
SEAL CANN UNIV 5-8 DVNC XI (MISCELLANEOUS) ×6 IMPLANT
SEAL XI 5MM-8MM UNIVERSAL (MISCELLANEOUS) ×3
SET TUBE SMOKE EVAC HIGH FLOW (TUBING) ×3 IMPLANT
SOLUTION ELECTROLUBE (MISCELLANEOUS) ×3 IMPLANT
SUT MNCRL AB 4-0 PS2 18 (SUTURE) ×3 IMPLANT
SUT STRATAFIX PDS 30 CT-1 (SUTURE) ×3 IMPLANT
SUT V-LOC 90 ABS 3-0 VLT  V-20 (SUTURE)
SUT V-LOC 90 ABS 3-0 VLT V-20 (SUTURE) IMPLANT
SUT VLOC 90 2/L VL 12 GS22 (SUTURE) ×6 IMPLANT
TAPE TRANSPORE STRL 2 31045 (GAUZE/BANDAGES/DRESSINGS) ×3 IMPLANT
TRAY FOLEY MTR SLVR 16FR STAT (SET/KITS/TRAYS/PACK) IMPLANT
WATER STERILE IRR 500ML POUR (IV SOLUTION) ×2 IMPLANT

## 2021-05-23 NOTE — Transfer of Care (Signed)
Immediate Anesthesia Transfer of Care Note  Patient: Andres Underwood  Procedure(s) Performed: XI ROBOTIC ASSISTED VENTRAL HERNIA (Abdomen) INSERTION OF MESH  Patient Location: PACU  Anesthesia Type:General  Level of Consciousness: sedated  Airway & Oxygen Therapy: Patient Spontanous Breathing and Patient connected to face mask oxygen  Post-op Assessment: Report given to RN and Post -op Vital signs reviewed and stable  Post vital signs: Reviewed  Last Vitals:  Vitals Value Taken Time  BP 139/85 05/23/21 0919  Temp 36.3 C 05/23/21 0919  Pulse 61 05/23/21 0920  Resp 17 05/23/21 0920  SpO2 97 % 05/23/21 0920  Vitals shown include unvalidated device data.  Last Pain:  Vitals:   05/23/21 0631  TempSrc: Oral  PainSc: 0-No pain         Complications: No notable events documented.

## 2021-05-23 NOTE — Anesthesia Postprocedure Evaluation (Signed)
Anesthesia Post Note  Patient: Andres Underwood  Procedure(s) Performed: XI ROBOTIC ASSISTED VENTRAL HERNIA (Abdomen) INSERTION OF MESH  Patient location during evaluation: PACU Anesthesia Type: General Level of consciousness: awake and alert, oriented and patient cooperative Pain management: pain level controlled Vital Signs Assessment: post-procedure vital signs reviewed and stable Respiratory status: spontaneous breathing, nonlabored ventilation and respiratory function stable Cardiovascular status: blood pressure returned to baseline and stable Postop Assessment: adequate PO intake Anesthetic complications: no   No notable events documented.   Last Vitals:  Vitals:   05/23/21 1000 05/23/21 1013  BP: (!) 139/91 (!) 142/89  Pulse:  64  Resp:  17  Temp:  (!) 36.3 C  SpO2:  97%    Last Pain:  Vitals:   05/23/21 1013  TempSrc: Temporal  PainSc: [place]

## 2021-05-23 NOTE — Progress Notes (Signed)
°   05/23/21 0700  Clinical Encounter Type  Visited With Patient  Visit Type Initial;Pre-op

## 2021-05-23 NOTE — Anesthesia Preprocedure Evaluation (Signed)
Anesthesia Evaluation  Patient identified by MRN, date of birth, ID band Patient awake    Reviewed: Allergy & Precautions, NPO status , Patient's Chart, lab work & pertinent test results  History of Anesthesia Complications Negative for: history of anesthetic complications  Airway Mallampati: III   Neck ROM: Full    Dental  (+)    Pulmonary neg pulmonary ROS,    Pulmonary exam normal breath sounds clear to auscultation       Cardiovascular Exercise Tolerance: Good negative cardio ROS Normal cardiovascular exam Rhythm:Regular Rate:Normal     Neuro/Psych negative neurological ROS     GI/Hepatic GERD  ,  Endo/Other  Obesity   Renal/GU negative Renal ROS     Musculoskeletal  (+) Arthritis , Skin SCC   Abdominal   Peds  Hematology negative hematology ROS (+)   Anesthesia Other Findings   Reproductive/Obstetrics                             Anesthesia Physical Anesthesia Plan  ASA: 2  Anesthesia Plan: General   Post-op Pain Management:    Induction: Intravenous  PONV Risk Score and Plan: 2 and Ondansetron, Dexamethasone and Treatment may vary due to age or medical condition  Airway Management Planned: Oral ETT  Additional Equipment:   Intra-op Plan:   Post-operative Plan: Extubation in OR  Informed Consent: I have reviewed the patients History and Physical, chart, labs and discussed the procedure including the risks, benefits and alternatives for the proposed anesthesia with the patient or authorized representative who has indicated his/her understanding and acceptance.     Dental advisory given  Plan Discussed with: CRNA  Anesthesia Plan Comments: (Patient consented for risks of anesthesia including but not limited to:  - adverse reactions to medications - damage to eyes, teeth, lips or other oral mucosa - nerve damage due to positioning  - sore throat or hoarseness -  damage to heart, brain, nerves, lungs, other parts of body or loss of life  Informed patient about role of CRNA in peri- and intra-operative care.  Patient voiced understanding.)        Anesthesia Quick Evaluation

## 2021-05-23 NOTE — Anesthesia Procedure Notes (Signed)
Procedure Name: Intubation Date/Time: 05/23/2021 7:40 AM Performed by: Lia Foyer, CRNA Pre-anesthesia Checklist: Patient identified, Emergency Drugs available, Suction available and Patient being monitored Patient Re-evaluated:Patient Re-evaluated prior to induction Oxygen Delivery Method: Circle system utilized Preoxygenation: Pre-oxygenation with 100% oxygen Induction Type: IV induction Ventilation: Mask ventilation without difficulty Laryngoscope Size: McGraph and 4 Grade View: Grade II Tube type: Oral Tube size: 7.5 mm Number of attempts: 1 Airway Equipment and Method: Stylet, Video-laryngoscopy and Oral airway Placement Confirmation: ETT inserted through vocal cords under direct vision, positive ETCO2 and breath sounds checked- equal and bilateral Secured at: 23 cm Tube secured with: Tape Dental Injury: Teeth and Oropharynx as per pre-operative assessment

## 2021-05-23 NOTE — Discharge Instructions (Addendum)
°  Diet: Resume home heart healthy regular diet.   Activity: No heavy lifting >20 pounds (children, pets, laundry, garbage) or strenuous activity until follow-up, but light activity and walking are encouraged. Do not drive or drink alcohol if taking narcotic pain medications.  Wound care: May shower with soapy water and pat dry (do not rub incisions), but no baths or submerging incision underwater until follow-up. (no swimming)   Medications: Resume all home medications. For mild to moderate pain: acetaminophen (Tylenol) or ibuprofen (if no kidney disease). Combining Tylenol with alcohol can substantially increase your risk of causing liver disease. Narcotic pain medications, if prescribed, can be used for severe pain, though may cause nausea, constipation, and drowsiness. Do not combine Tylenol and Norco within a 6 hour period as Norco contains Tylenol. If you do not need the narcotic pain medication, you do not need to fill the prescription.  Call office ([phone]) at any time if any questions, worsening pain, fevers/chills, bleeding, drainage from incision site, or other concerns.   AMBULATORY SURGERY  DISCHARGE INSTRUCTIONS   The drugs that you were given will stay in your system until tomorrow so for the next 24 hours you should not:  Drive an automobile Make any legal decisions Drink any alcoholic beverage   You may resume regular meals tomorrow.  Today it is better to start with liquids and gradually work up to solid foods.  You may eat anything you prefer, but it is better to start with liquids, then soup and crackers, and gradually work up to solid foods.   Please notify your doctor immediately if you have any unusual bleeding, trouble breathing, redness and pain at the surgery site, drainage, fever, or pain not relieved by medication.      Please contact your physician with any problems or Same Day Surgery at [phone], Monday through Friday 6 am to 4 pm, or [facility] [place] Main number at [phone].  

## 2021-05-23 NOTE — Interval H&P Note (Signed)
History and Physical Interval Note:  05/23/2021 7:01 AM  Andres Underwood  has presented today for surgery, with the diagnosis of K43.9 Ventral hernia w/o obstruction or gangrene.  The various methods of treatment have been discussed with the patient and family. After consideration of risks, benefits and other options for treatment, the patient has consented to  Procedure(s): XI ROBOTIC [place] (N/A) as a surgical intervention.  The patient's history has been reviewed, patient examined, no change in status, stable for surgery.  I have reviewed the patient's chart and labs.  Questions were answered to the patient's satisfaction.     Herbert Pun

## 2021-05-23 NOTE — Op Note (Signed)
Preoperative diagnosis: Ventral hernia  Postoperative diagnosis: Ventral and umbilical hernia  Procedure: Robotic assisted laparoscopic ventral and umbilical hernia repair with mesh  Anesthesia: General  Surgeon: Dr. Windell Moment  Wound Classification: Clean  Specimen: None  Complications: None  Estimated Blood Loss: 50ml  Indications: Patient is a 56 y.o. male developed a ventral hernia. This was symptomatic and incarcerated and repair was indicated.   Findings: A 2 cm ventral hernia and a 1 cm umbilical hernia with 5 cm from cephalad to caudal edge from shoulder 2. Repair achieved with closure of the anterior fascia at midline and 15 cm x 10 cm Bard mesh 3. Adequate hemostasis       Description of procedure: The patient was brought to the operating room and general anesthesia was induced. A time-out was completed verifying correct patient, procedure, site, positioning, and implant(s) and/or special equipment prior to beginning this procedure. Antibiotics were administered prior to making the incision. SCDs placed. The anterior abdominal wall was prepped and draped in the standard sterile fashion.   Palmer's point chosen for entry.  Veress needle placed and abdomen insufflated to 15cm without any dramatic increase in pressure.  Needle removed and optiview technique used to place 52mm port at same point.  No injury noted during placement. Exparel was infused in a TAP block. Two additional ports, 29mm x2 along left lateral aspect placed.  Xi robot then docked into place.  Hernia contents noted and reduced with combination of blunt, sharp dissection with scissors and fenestrated forceps.  Hemostasis achieved throughout this portion.  Once all hernia contents reduced, there was noted to be a a 2 cm ventral hernia and a 1 cm umbilical hernia hernia with 5 cm apart.    Insufflation dropped to 47mm and transfacial suture with 0 stratafix used to primarily close both defects under minimal  tension. Bard protected 15 x 11 cm mesh was placed within the abdominal cavity and secured to the abdominal wall centered over the defect using the 0 stratafix previously used to primarily close defect.  The mesh was then circumferentially sutured into the anterior abdominal wall using 2-0 VLock x2.  Any bleeding noted during this portion was no longer actively bleeding by end of securing mesh and tightening the suture.    Robot was undocked.  Abdomen then desufflated while camera within abdomen to ensure no signs of new bleed prior to removing camera and rest of ports completely.  All skin incisions closed with runninrg 4-0 Monocryl in a subcuticular fashion.  All wounds then dressed with Dermabond.  Patient was then successfully awakened and transferred to PACU in stable condition.  At the end of the procedure sponge and instrument counts were correct.

## 2021-05-24 ENCOUNTER — Encounter: Payer: Self-pay | Admitting: General Surgery

## 2022-04-06 IMAGING — CT CT ABD-PELV W/O CM
2 of 4 series · 16 of 46 positions shown, 18 images · non-contrast
Comparison: None.

CLINICAL DATA: Umbilical hernia for 3 years.

EXAM:
CT ABDOMEN AND PELVIS WITHOUT CONTRAST
TECHNIQUE: Multidetector CT imaging of the abdomen and pelvis was performed
following the standard protocol without IV contrast.

[Series 2: routine abd/pel wo · axial · 0.84mm/px · z∈[-942,-472]mm · 13 of 104 slices shown, 15 images]
[im 5/104  soft-tissue]
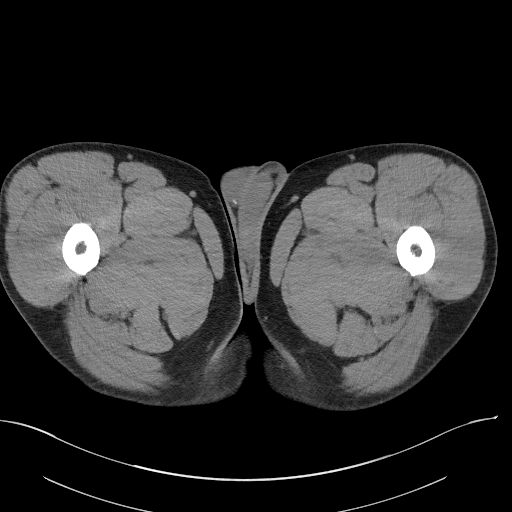
[im 5/104  bone]
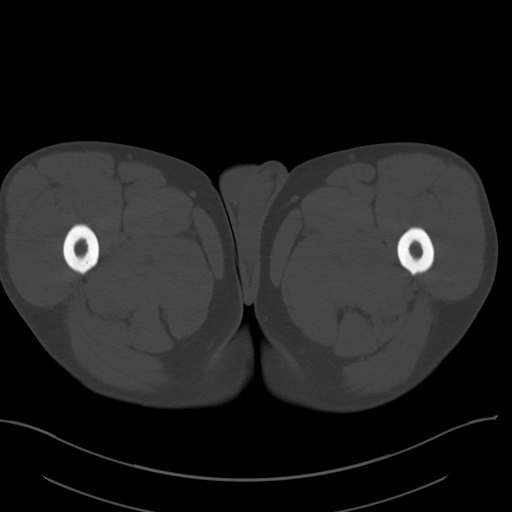
[im 13/104  soft-tissue]
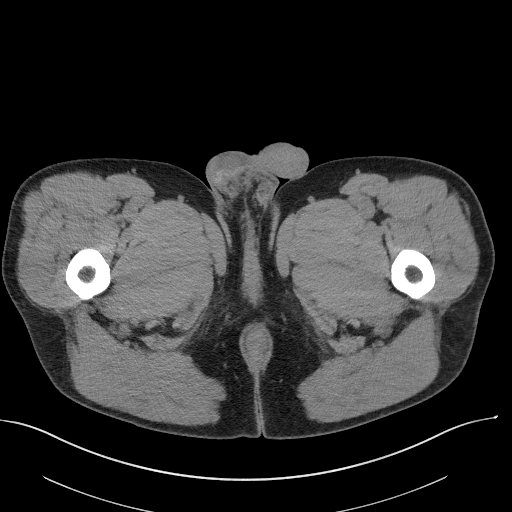
[im 21/104  soft-tissue]
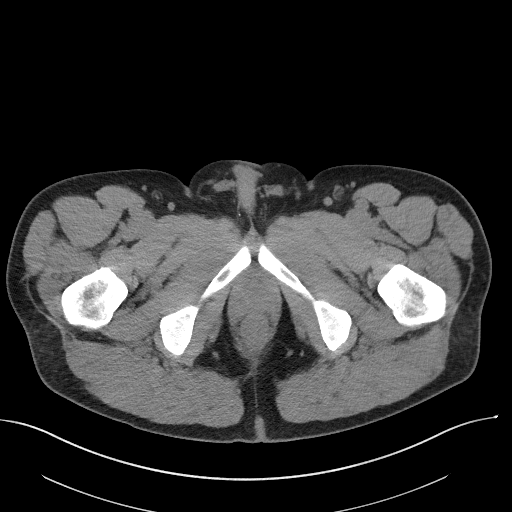
[im 29/104  soft-tissue]
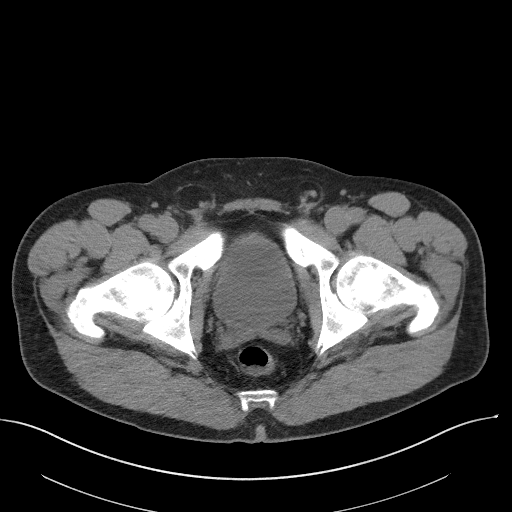
[im 38/104  soft-tissue]
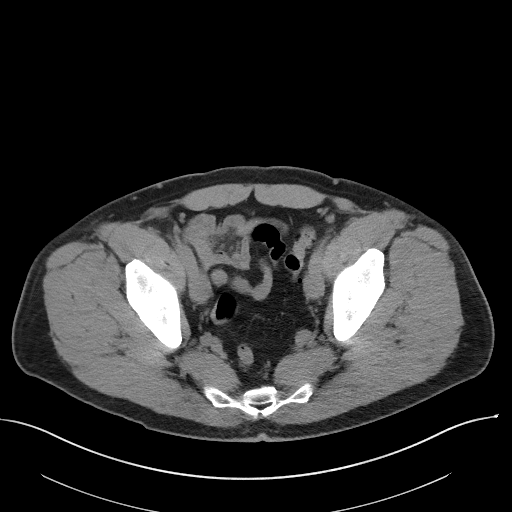
[im 46/104  soft-tissue]
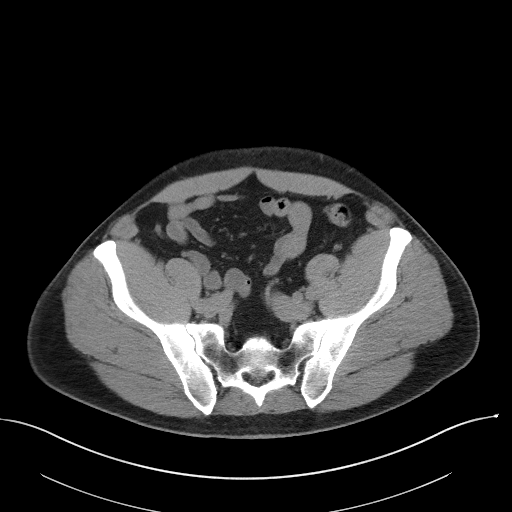
[im 54/104  soft-tissue]
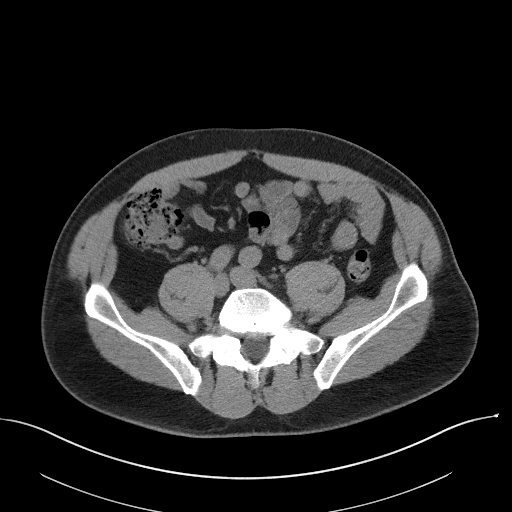
[im 58/104  soft-tissue]
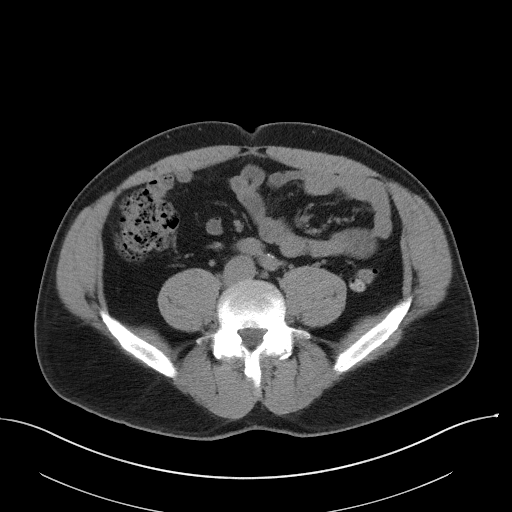
[im 66/104  soft-tissue]
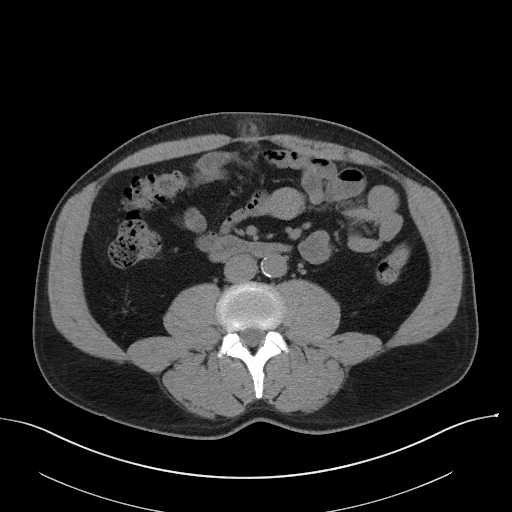
[im 66/104  bone]
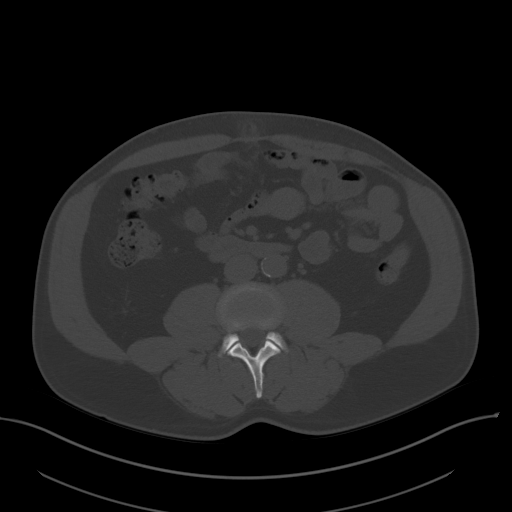
[im 75/104  soft-tissue]
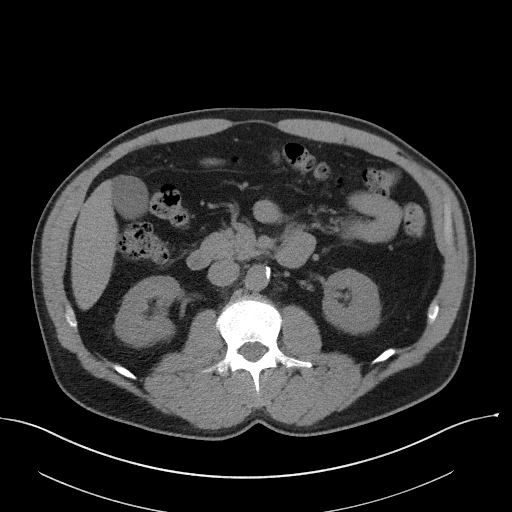
[im 83/104  soft-tissue]
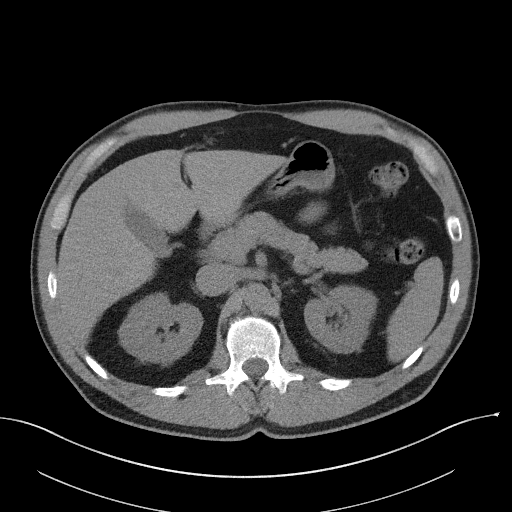
[im 91/104  soft-tissue]
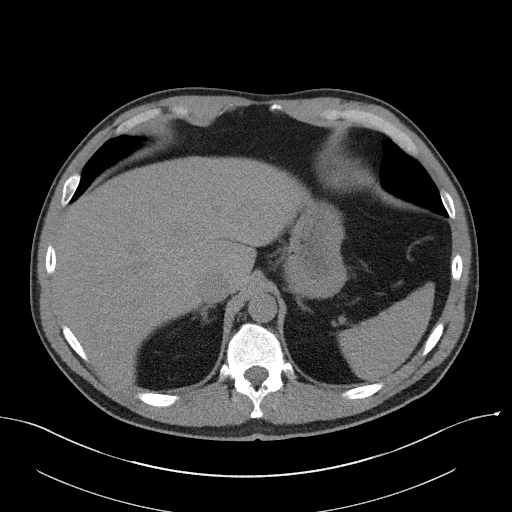
[im 99/104  soft-tissue]
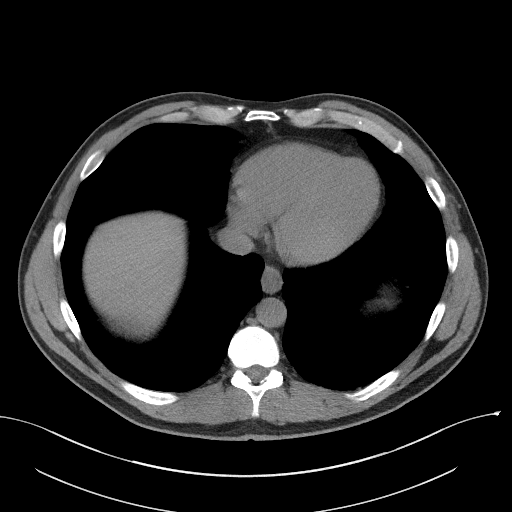

[Series 4: coronal st · coronal · 0.83mm/px · 3 of 98 slices shown]
[im 33/98  soft-tissue]
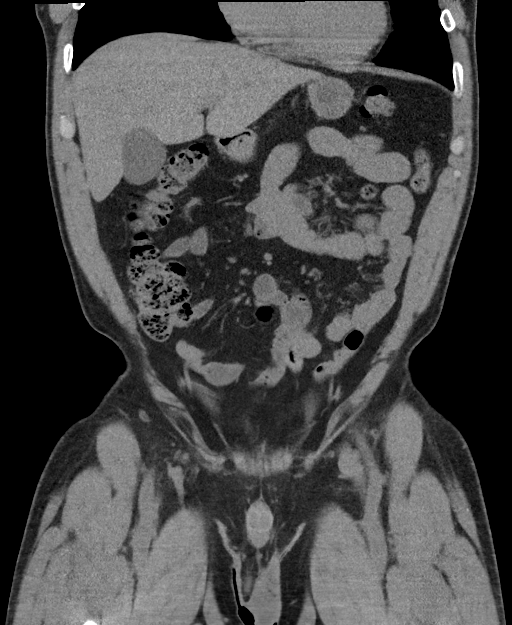
[im 44/98  soft-tissue]
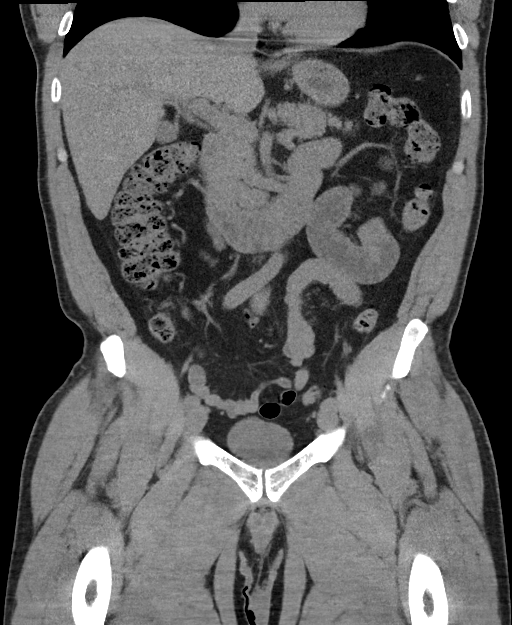
[im 54/98  soft-tissue]
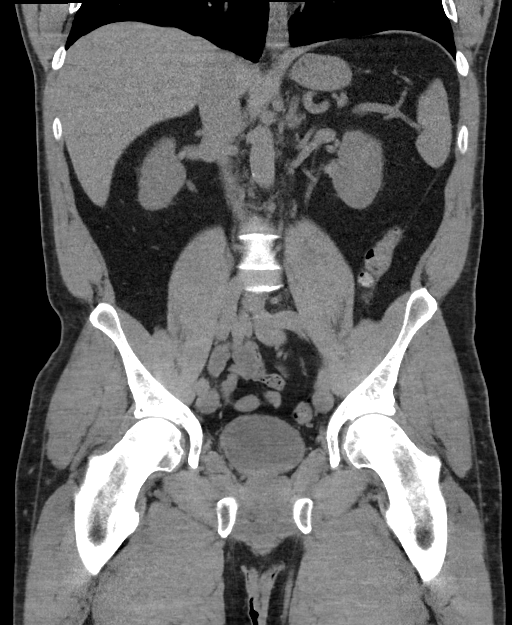

[16 of 46 positions shown; findings below may reference images not displayed]

FINDINGS: Lower chest: No acute abnormality.

Hepatobiliary: No focal liver abnormality is seen. No gallstones,
gallbladder wall thickening, or biliary dilatation.

Pancreas: Unremarkable. No pancreatic ductal dilatation or
surrounding inflammatory changes.

Spleen: Normal in size.  Small calcified granuloma.

Adrenals/Urinary Tract: Adrenal glands are unremarkable. There is a
1.9 cm simple cyst in the upper pole of the left kidney. No renal
calculi or hydronephrosis. The bladder is unremarkable.

Stomach/Bowel: Stomach is within normal limits. Appendix appears
normal. No evidence of bowel wall thickening, distention, or
inflammatory changes.

Vascular/Lymphatic: Aortic atherosclerosis. No enlarged abdominal or
pelvic lymph nodes.

Reproductive: Prostate is unremarkable

Other: Small midline supraumbilical hernia containing chronically
inflamed fat. The neck measures approximately 1 cm. Additional small
fat-containing umbilical and right greater than left inguinal
hernias. No free fluid or pneumoperitoneum.

Musculoskeletal: No acute or significant osseous findings.
IMPRESSION: 1. Small midline supraumbilical hernia containing chronically
inflamed fat.
2. Additional small fat-containing umbilical and right greater than
left inguinal hernias.
3. Aortic Atherosclerosis ([id number]).
